# Patient Record
Sex: Female | Born: 1990 | Race: Black or African American | Hispanic: No | Marital: Single | Smoking: Never smoker
Health system: Southern US, Community
[De-identification: ages and names within clinical notes are randomized; demographics above are authoritative.]

## PROBLEM LIST (undated history)

## (undated) DIAGNOSIS — O009 Unspecified ectopic pregnancy without intrauterine pregnancy: Secondary | ICD-10-CM

---

## 2014-08-12 ENCOUNTER — Other Ambulatory Visit (HOSPITAL_BASED_OUTPATIENT_CLINIC_OR_DEPARTMENT_OTHER): Payer: Self-pay | Admitting: Emergency Medicine

## 2014-08-12 ENCOUNTER — Emergency Department (HOSPITAL_BASED_OUTPATIENT_CLINIC_OR_DEPARTMENT_OTHER)
Admission: EM | Admit: 2014-08-12 | Discharge: 2014-08-12 | Disposition: A | Discharge status: Home / Self Care | Payer: Self-pay | Attending: Emergency Medicine | Admitting: Emergency Medicine

## 2014-08-12 ENCOUNTER — Encounter (HOSPITAL_BASED_OUTPATIENT_CLINIC_OR_DEPARTMENT_OTHER): Payer: Self-pay | Admitting: Emergency Medicine

## 2014-08-12 ENCOUNTER — Ambulatory Visit (HOSPITAL_BASED_OUTPATIENT_CLINIC_OR_DEPARTMENT_OTHER)
Admit: 2014-08-12 | Discharge: 2014-08-12 | Disposition: A | Discharge status: Home / Self Care | Payer: Self-pay | Source: Ambulatory Visit | Attending: Emergency Medicine | Admitting: Emergency Medicine

## 2014-08-12 ENCOUNTER — Ambulatory Visit (HOSPITAL_BASED_OUTPATIENT_CLINIC_OR_DEPARTMENT_OTHER): Admit: 2014-08-12 | Payer: Self-pay

## 2014-08-12 DIAGNOSIS — R1031 Right lower quadrant pain: Secondary | ICD-10-CM | POA: Insufficient documentation

## 2014-08-12 DIAGNOSIS — Z3202 Encounter for pregnancy test, result negative: Secondary | ICD-10-CM | POA: Insufficient documentation

## 2014-08-12 DIAGNOSIS — Z8742 Personal history of other diseases of the female genital tract: Secondary | ICD-10-CM | POA: Insufficient documentation

## 2014-08-12 DIAGNOSIS — R9389 Abnormal findings on diagnostic imaging of other specified body structures: Secondary | ICD-10-CM | POA: Insufficient documentation

## 2014-08-12 DIAGNOSIS — R102 Pelvic and perineal pain: Secondary | ICD-10-CM

## 2014-08-12 DIAGNOSIS — Z79899 Other long term (current) drug therapy: Secondary | ICD-10-CM | POA: Insufficient documentation

## 2014-08-12 DIAGNOSIS — R109 Unspecified abdominal pain: Secondary | ICD-10-CM

## 2014-08-12 HISTORY — DX: Unspecified ectopic pregnancy without intrauterine pregnancy: O00.90

## 2014-08-12 LAB — PREGNANCY, URINE: PREG TEST UR: NEGATIVE

## 2014-08-12 LAB — URINALYSIS, ROUTINE W REFLEX MICROSCOPIC
Bilirubin Urine: NEGATIVE
Glucose, UA: NEGATIVE mg/dL
Hgb urine dipstick: NEGATIVE
Ketones, ur: NEGATIVE mg/dL
Leukocytes, UA: NEGATIVE
NITRITE: NEGATIVE
Protein, ur: NEGATIVE mg/dL
Specific Gravity, Urine: 1.005 (ref 1.005–1.030)
UROBILINOGEN UA: 0.2 mg/dL (ref 0.0–1.0)
pH: 6.5 (ref 5.0–8.0)

## 2014-08-12 MED ORDER — TRAMADOL HCL 50 MG PO TABS: 50.0000 mg | ORAL_TABLET | Freq: Four times a day (QID) | ORAL | Status: DC | PRN

## 2014-08-12 NOTE — ED Provider Notes (Signed)
CSN: [id number]     Arrival date & time 08/12/14  0005 History   First MD Initiated Contact with Patient 08/12/14 0045     Chief Complaint  Patient presents with  . Abdominal Pain     (Consider location/radiation/quality/duration/timing/severity/associated sxs/prior Treatment) HPI Comments: Patient is a 23 year old female with no significant past medical history. She presents with complaints of pain in her lower abdomen that has been coming and going for the past month. It is getting worse. There are no fevers and chills. She denies any urinary complaints. She states last time she felt like this she was pregnant and had a cyst on her ovary.  Patient is a 23 y.o. female presenting with abdominal pain. The history is provided by the patient.  Abdominal Pain Pain location:  RLQ Pain quality: cramping   Pain severity:  Moderate Onset quality:  Gradual Duration:  30 days Timing:  Intermittent Progression:  Worsening Chronicity:  Recurrent Relieved by:  Nothing Worsened by:  Movement and palpation Ineffective treatments:  None tried   Past Medical History  Diagnosis Date  . Ectopic pregnancy    History reviewed. No pertinent past surgical history. History reviewed. No pertinent family history. History  Substance Use Topics  . Smoking status: Never Smoker   . Smokeless tobacco: Not on file  . Alcohol Use: No   OB History   Grav Para Term Preterm Abortions TAB SAB Ect Mult Living                 Review of Systems  Gastrointestinal: Positive for abdominal pain.  All other systems reviewed and are negative.     Allergies  Review of patient's allergies indicates no known allergies.  Home Medications   Prior to Admission medications   Medication Sig Start Date End Date Taking? Authorizing Provider  valACYclovir (VALTREX) 500 MG tablet Take 500 mg by mouth 2 (two) times daily.   Yes Historical Provider, MD   BP 133/80  Pulse 82  Temp(Src) 97.9 F (36.6 C) (Oral)   Resp 18  Ht 5\' 10"  (1.778 m)  Wt 125 lb (56.7 kg)  BMI 17.94 kg/m2  SpO2 100%  LMP 07/31/2014 Physical Exam  Nursing note and vitals reviewed. Constitutional: She is oriented to person, place, and time. She appears well-developed and well-nourished. No distress.  HENT:  Head: Normocephalic and atraumatic.  Neck: Normal range of motion. Neck supple.  Cardiovascular: Normal rate and regular rhythm.  Exam reveals no gallop and no friction rub.   No murmur heard. Pulmonary/Chest: Effort normal and breath sounds normal. No respiratory distress. She has no wheezes.  Abdominal: Soft. Bowel sounds are normal. She exhibits no distension. There is tenderness. There is no rebound and no guarding.  There is mild tenderness to palpation in the right lower quadrant and suprapubic region. There is no rebound and no guarding.  Musculoskeletal: Normal range of motion.  Neurological: She is alert and oriented to person, place, and time.  Skin: Skin is warm and dry. She is not diaphoretic.    ED Course  Procedures (including critical care time) Labs Review Labs Reviewed  URINALYSIS, ROUTINE W REFLEX MICROSCOPIC  PREGNANCY, URINE    Imaging Review No results found.   EKG Interpretation None      MDM   Final diagnoses:  None    Urinalysis shows nothing to suggest infection or renal calculus. Pregnancy test is negative. He states his pain is similar to her prior discomfort she had an ovarian cyst  I suspect that is what is going on again. As there is no ultrasound tech on duty this evening, I will bring her back tomorrow for an ultrasound to rule out this possibility. She is not having any bleeding or discharge and at her request we'll forego the pelvic examination at this point. I will prescribe tramadol she can take as needed for pain.    Geoffery Lyons, MD 08/12/14 [id number]

## 2014-08-12 NOTE — ED Provider Notes (Signed)
Patient returned for [place].  Presented last night with right lower quadrant pain. Ultrasound results are below. Discuss with patient right ovarian cyst which is likely the cause of her pain. Patient reports improvement of pain. Patient also has a regular endometrial stripe for which she needs followup. She will followup with her OB/GYN. Patient was given imaging report for followup purposes.  Results for orders placed during the hospital encounter of 08/12/14  URINALYSIS, ROUTINE W REFLEX MICROSCOPIC      Result Value Ref Range   Color, Urine YELLOW  YELLOW   APPearance CLEAR  CLEAR   Specific Gravity, Urine 1.005  1.005 - 1.030   pH 6.5  5.0 - 8.0   Glucose, UA NEGATIVE  NEGATIVE mg/dL   Hgb urine dipstick NEGATIVE  NEGATIVE   Bilirubin Urine NEGATIVE  NEGATIVE   Ketones, ur NEGATIVE  NEGATIVE mg/dL   Protein, ur NEGATIVE  NEGATIVE mg/dL   Urobilinogen, UA 0.2  0.0 - 1.0 mg/dL   Nitrite NEGATIVE  NEGATIVE   Leukocytes, UA NEGATIVE  NEGATIVE  PREGNANCY, URINE      Result Value Ref Range   Preg Test, Ur NEGATIVE  NEGATIVE   US Transvaginal Non-ob  08/12/2014   CLINICAL DATA:  RIGHT lower quadrant pain for 1 month, negative pregnancy test, history of ovarian cysts, past history ectopic pregnancy  EXAM: TRANSABDOMINAL AND TRANSVAGINAL ULTRASOUND OF PELVIS  TECHNIQUE: Both transabdominal and transvaginal ultrasound examinations of the pelvis were performed. Transabdominal technique was performed for global imaging of the pelvis including uterus, ovaries, adnexal regions, and pelvic cul-de-sac. It was necessary to proceed with endovaginal exam following the transabdominal exam to visualize the endometrium.  COMPARISON:  09/21/2011 early OB ultrasound  FINDINGS: Uterus  Measurements: 8.9 x 5.3 x 5.9 cm. Heterogeneous myometrial echogenicity. No focal mass lesion.  Endometrium  Thickness: 15 mm thick. Abnormal appearance, heterogeneous with areas of hypo echogenicity which may represent complex fluid  and/or blood.  Right ovary  Measurements: 5.5 x 2.1 x 3.3 cm. Small collapsed cyst 2.2 x 1.5 x 1.8 cm. No additional mass. Blood flow present within RIGHT ovary on color Doppler imaging.  Left ovary  Measurements: 3.8 x 2.3 x 2.2 cm. Normal morphology without mass. Blood flow present within LEFT ovary on color Doppler imaging.  Other findings  Small to moderate amount of simple appearing free pelvic fluid.  IMPRESSION: Collapsed RIGHT ovarian cyst with small to moderate amount of free pelvic fluid.  Abnormal appearance of the endometrial complex, 15 mm thick but heterogeneous in echogenicity with areas of hypo echogenicity question fluid versus blood.  A focal endometrial lesion is questioned.  Consider sonohysterogram for further evaluation, prior to hysteroscopy or endometrial biopsy.   Electronically Signed   By: Ulyses Southward M.D.   On: 08/12/2014 13:32   US Pelvis Complete  08/12/2014   CLINICAL DATA:  RIGHT lower quadrant pain for 1 month, negative pregnancy test, history of ovarian cysts, past history ectopic pregnancy  EXAM: TRANSABDOMINAL AND TRANSVAGINAL ULTRASOUND OF PELVIS  TECHNIQUE: Both transabdominal and transvaginal ultrasound examinations of the pelvis were performed. Transabdominal technique was performed for global imaging of the pelvis including uterus, ovaries, adnexal regions, and pelvic cul-de-sac. It was necessary to proceed with endovaginal exam following the transabdominal exam to visualize the endometrium.  COMPARISON:  09/21/2011 early OB ultrasound  FINDINGS: Uterus  Measurements: 8.9 x 5.3 x 5.9 cm. Heterogeneous myometrial echogenicity. No focal mass lesion.  Endometrium  Thickness: 15 mm thick. Abnormal appearance, heterogeneous with  areas of hypo echogenicity which may represent complex fluid and/or blood.  Right ovary  Measurements: 5.5 x 2.1 x 3.3 cm. Small collapsed cyst 2.2 x 1.5 x 1.8 cm. No additional mass. Blood flow present within RIGHT ovary on color Doppler imaging.  Left  ovary  Measurements: 3.8 x 2.3 x 2.2 cm. Normal morphology without mass. Blood flow present within LEFT ovary on color Doppler imaging.  Other findings  Small to moderate amount of simple appearing free pelvic fluid.  IMPRESSION: Collapsed RIGHT ovarian cyst with small to moderate amount of free pelvic fluid.  Abnormal appearance of the endometrial complex, 15 mm thick but heterogeneous in echogenicity with areas of hypo echogenicity question fluid versus blood.  A focal endometrial lesion is questioned.  Consider sonohysterogram for further evaluation, prior to hysteroscopy or endometrial biopsy.   Electronically Signed   By: Ulyses SouthwardMark  Boles M.D.   On: 08/12/2014 13:32      Shon Batonourtney F Jomes Giraldo, MD 08/12/14 1344

## 2014-08-12 NOTE — ED Notes (Signed)
Pt reports lower abdominal pain for past month that has gotten progressively worse, now c/o difficulty urinating

## 2014-08-12 NOTE — Discharge Instructions (Signed)
Tramadol as needed for pain.  Return tomorrow for an ultrasound at a given time to rule out the possibility of an ovarian cyst.   Abdominal Pain, Women Abdominal (stomach, pelvic, or belly) pain can be caused by many things. It is important to tell your doctor:  The location of the pain.  Does it come and go or is it present all the time?  Are there things that start the pain (eating certain foods, exercise)?  Are there other symptoms associated with the pain (fever, nausea, vomiting, diarrhea)? All of this is helpful to know when trying to find the cause of the pain. CAUSES   Stomach: virus or bacteria infection, or ulcer.  Intestine: appendicitis (inflamed appendix), regional ileitis (Crohn's disease), ulcerative colitis (inflamed colon), irritable bowel syndrome, diverticulitis (inflamed diverticulum of the colon), or cancer of the stomach or intestine.  Gallbladder disease or stones in the gallbladder.  Kidney disease, kidney stones, or infection.  Pancreas infection or cancer.  Fibromyalgia (pain disorder).  Diseases of the female organs:  Uterus: fibroid (non-cancerous) tumors or infection.  Fallopian tubes: infection or tubal pregnancy.  Ovary: cysts or tumors.  Pelvic adhesions (scar tissue).  Endometriosis (uterus lining tissue growing in the pelvis and on the pelvic organs).  Pelvic congestion syndrome (female organs filling up with blood just before the menstrual period).  Pain with the menstrual period.  Pain with ovulation (producing an egg).  Pain with an IUD (intrauterine device, birth control) in the uterus.  Cancer of the female organs.  Functional pain (pain not caused by a disease, may improve without treatment).  Psychological pain.  Depression. DIAGNOSIS  Your doctor will decide the seriousness of your pain by doing an examination.  Blood tests.  X-rays.  Ultrasound.  CT scan (computed tomography, special type of X-ray).  MRI  (magnetic resonance imaging).  Cultures, for infection.  Barium enema (dye inserted in the large intestine, to better view it with X-rays).  Colonoscopy (looking in intestine with a lighted tube).  Laparoscopy (minor surgery, looking in abdomen with a lighted tube).  Major abdominal exploratory surgery (looking in abdomen with a large incision). TREATMENT  The treatment will depend on the cause of the pain.   Many cases can be observed and treated at home.  Over-the-counter medicines recommended by your caregiver.  Prescription medicine.  Antibiotics, for infection.  Birth control pills, for painful periods or for ovulation pain.  Hormone treatment, for endometriosis.  Nerve blocking injections.  Physical therapy.  Antidepressants.  Counseling with a psychologist or psychiatrist.  Minor or major surgery. HOME CARE INSTRUCTIONS   Do not take laxatives, unless directed by your caregiver.  Take over-the-counter pain medicine only if ordered by your caregiver. Do not take aspirin because it can cause an upset stomach or bleeding.  Try a clear liquid diet (broth or water) as ordered by your caregiver. Slowly move to a bland diet, as tolerated, if the pain is related to the stomach or intestine.  Have a thermometer and take your temperature several times a day, and record it.  Bed rest and sleep, if it helps the pain.  Avoid sexual intercourse, if it causes pain.  Avoid stressful situations.  Keep your follow-up appointments and tests, as your caregiver orders.  If the pain does not go away with medicine or surgery, you may try:  Acupuncture.  Relaxation exercises (yoga, meditation).  Group therapy.  Counseling. SEEK MEDICAL CARE IF:   You notice certain foods cause stomach pain.  Your home care treatment is not helping your pain.  You need stronger pain medicine.  You want your IUD removed.  You feel faint or lightheaded.  You develop nausea and  vomiting.  You develop a rash.  You are having side effects or an allergy to your medicine. SEEK IMMEDIATE MEDICAL CARE IF:   Your pain does not go away or gets worse.  You have a fever.  Your pain is felt only in portions of the abdomen. The right side could possibly be appendicitis. The left lower portion of the abdomen could be colitis or diverticulitis.  You are passing blood in your stools (bright red or black tarry stools, with or without vomiting).  You have blood in your urine.  You develop chills, with or without a fever.  You pass out. MAKE SURE YOU:   Understand these instructions.  Will watch your condition.  Will get help right away if you are not doing well or get worse. Document Released: 10/13/2007 Document Revised: 05/02/2014 Document Reviewed: 11/02/2009 [facility] Patient Information 2015 [place], [place]. This information is not intended to replace advice given to you by your health care provider. Make sure you discuss any questions you have with your health care provider.

## 2015-05-16 IMAGING — US US TRANSVAGINAL NON-OB
1 series · 13 of 25 positions shown · non-contrast
Comparison: 09/21/2011 early OB ultrasound

CLINICAL DATA: RIGHT lower quadrant pain for 1 month, negative
pregnancy test, history of ovarian cysts, past history ectopic
pregnancy



[Series 1: us transvaginal non-ob · 0.26mm/px · 13 of 107 slices shown]
[im 1/107]
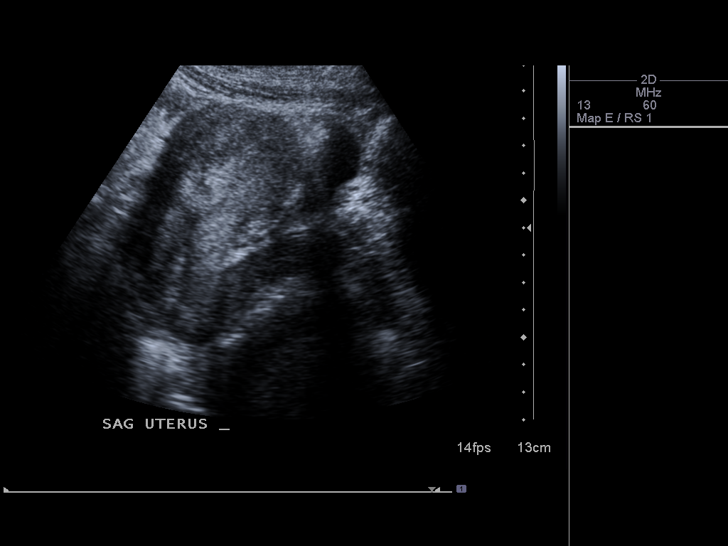
[im 9/107]
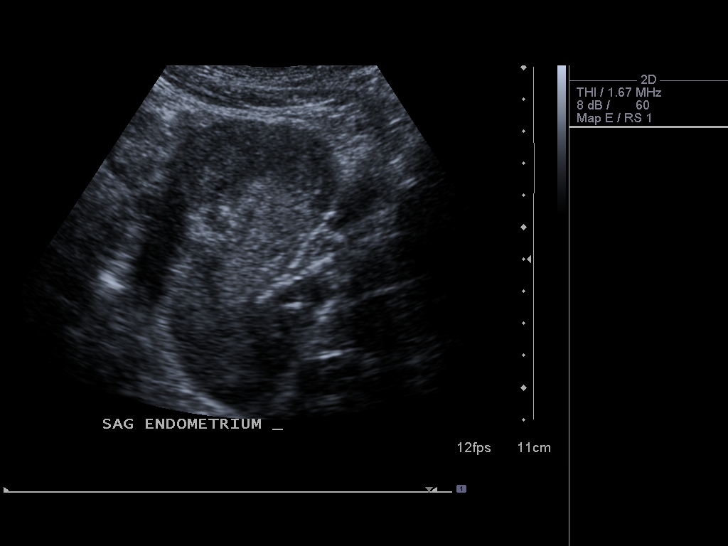
[im 18/107]
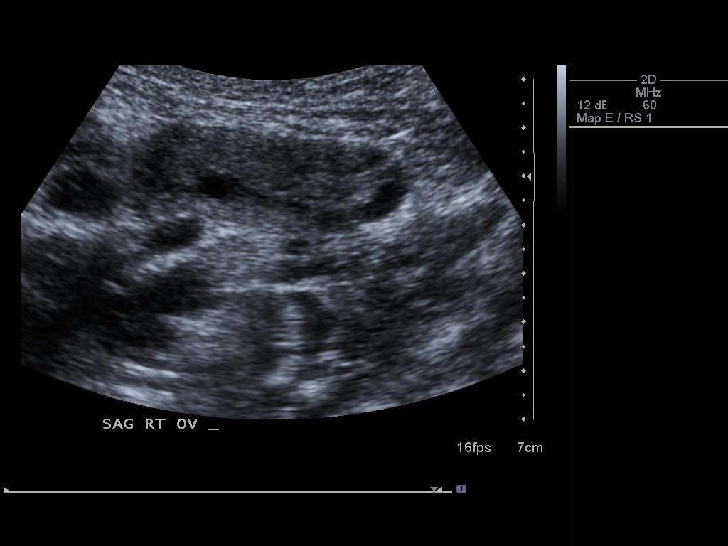
[im 27/107]
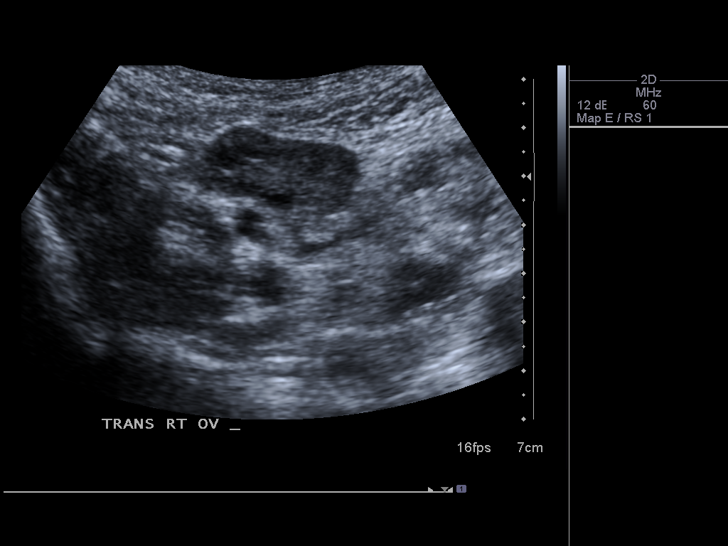
[im 36/107]
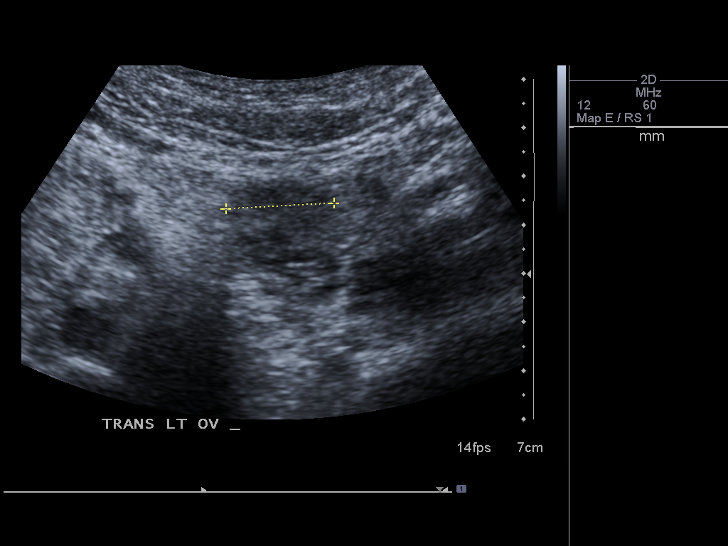
[im 45/107]
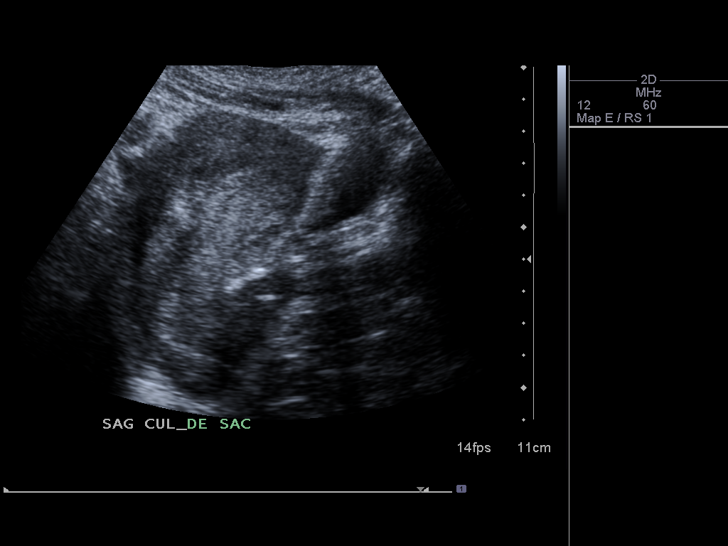
[im 54/107]
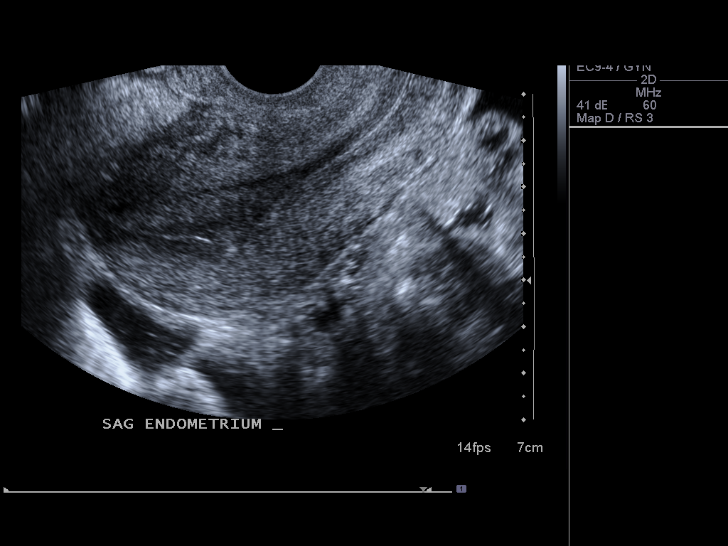
[im 62/107]
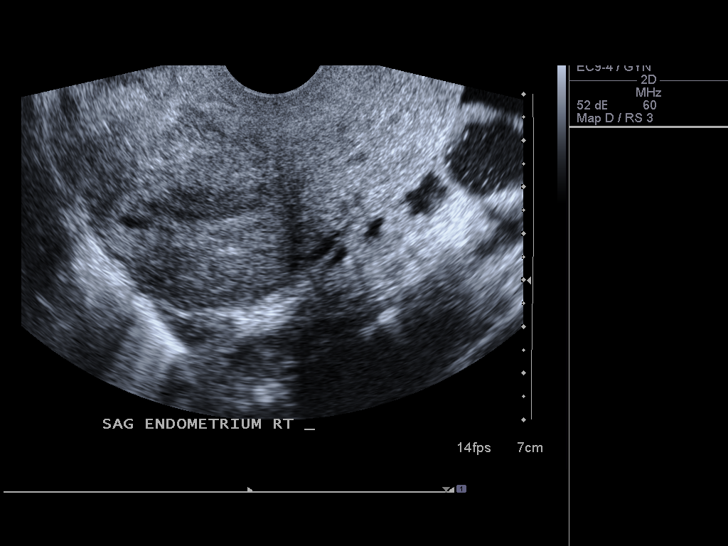
[im 71/107]
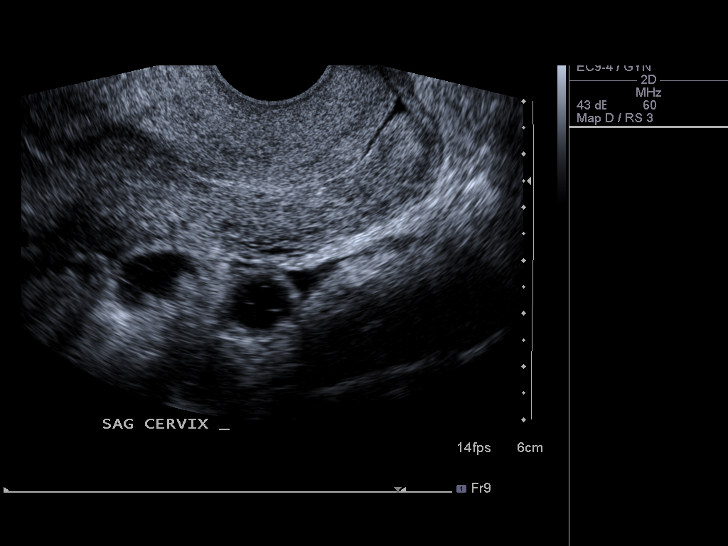
[im 80/107]
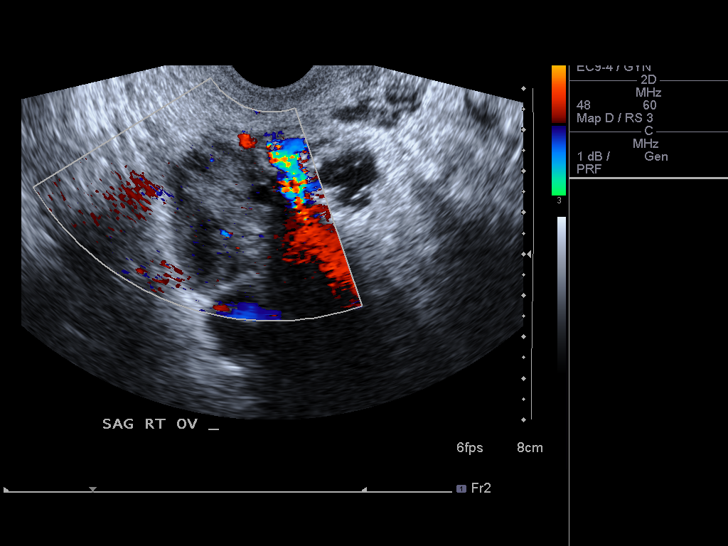
[im 89/107]
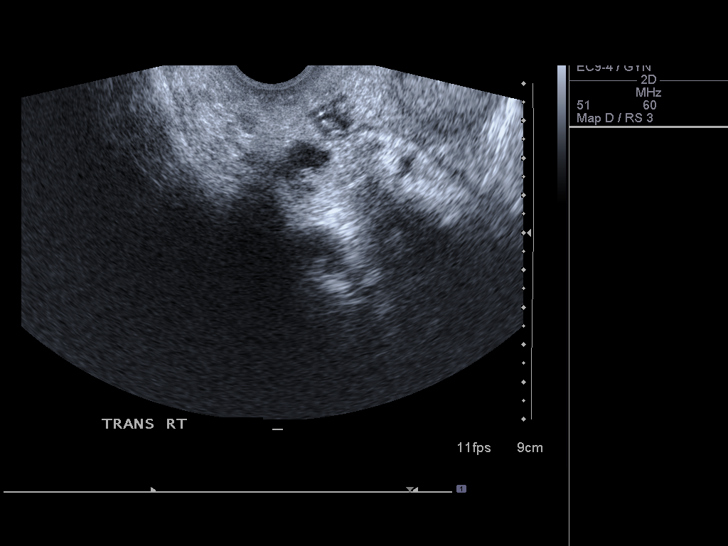
[im 98/107]
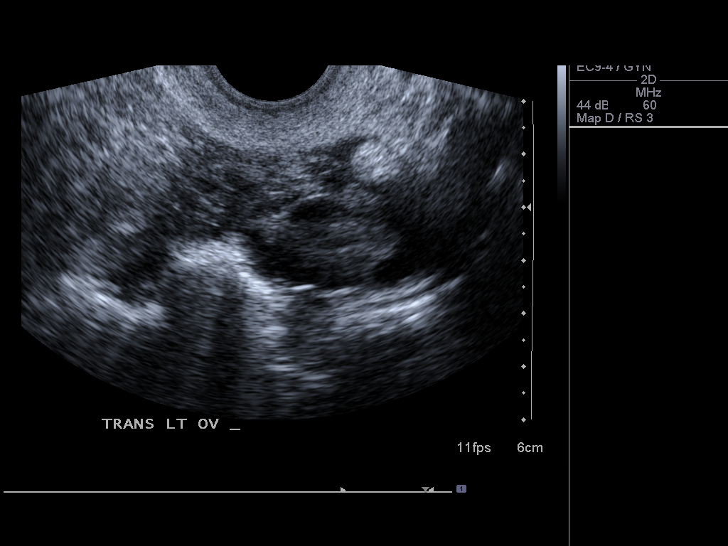
[im 107/107]
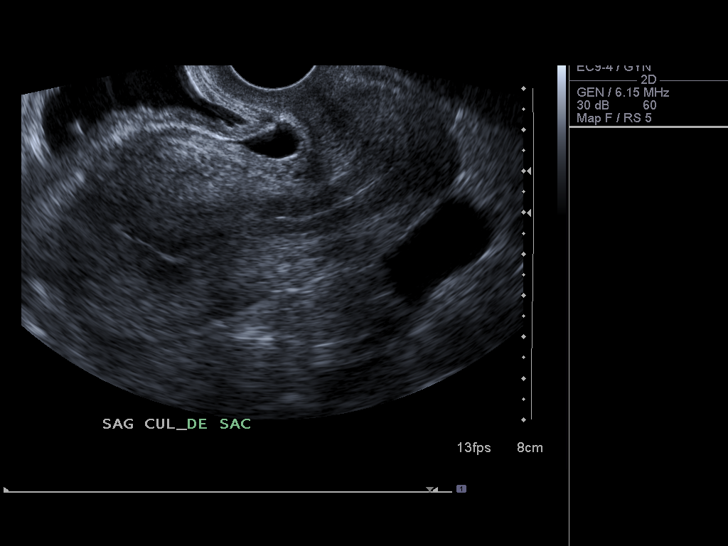

[13 of 25 positions shown; findings below may reference images not displayed]

FINDINGS: Uterus

Measurements: 8.9 x 5.3 x 5.9 cm. Heterogeneous myometrial
echogenicity. No focal mass lesion.

Endometrium

Thickness: 15 mm thick. Abnormal appearance, heterogeneous with
areas of hypo echogenicity which may represent complex fluid and/or
blood.

Right ovary

Measurements: 5.5 x 2.1 x 3.3 cm. Small collapsed cyst 2.2 x 1.5 x
1.8 cm. No additional mass. Blood flow present within RIGHT ovary on
color Doppler imaging.

Left ovary

Measurements: 3.8 x 2.3 x 2.2 cm. Normal morphology without mass.
Blood flow present within LEFT ovary on color Doppler imaging.

Other findings

Small to moderate amount of simple appearing free pelvic fluid.
IMPRESSION: Collapsed RIGHT ovarian cyst with small to moderate amount of free
pelvic fluid.

Abnormal appearance of the endometrial complex, 15 mm thick but
heterogeneous in echogenicity with areas of hypo echogenicity
question fluid versus blood.

A focal endometrial lesion is questioned.

Consider sonohysterogram for further evaluation, prior to
hysteroscopy or endometrial biopsy.

## 2018-05-02 ENCOUNTER — Emergency Department (HOSPITAL_BASED_OUTPATIENT_CLINIC_OR_DEPARTMENT_OTHER)
Admission: EM | Admit: 2018-05-02 | Discharge: 2018-05-02 | Disposition: A | Discharge status: Home / Self Care | Payer: 59 | Attending: Emergency Medicine | Admitting: Emergency Medicine

## 2018-05-02 ENCOUNTER — Encounter (HOSPITAL_BASED_OUTPATIENT_CLINIC_OR_DEPARTMENT_OTHER): Payer: Self-pay | Admitting: Emergency Medicine

## 2018-05-02 ENCOUNTER — Emergency Department (HOSPITAL_BASED_OUTPATIENT_CLINIC_OR_DEPARTMENT_OTHER): Payer: 59

## 2018-05-02 ENCOUNTER — Other Ambulatory Visit: Payer: Self-pay

## 2018-05-02 DIAGNOSIS — Z79899 Other long term (current) drug therapy: Secondary | ICD-10-CM | POA: Insufficient documentation

## 2018-05-02 LAB — CBC WITH DIFFERENTIAL/PLATELET
BASOS ABS: 0 10*3/uL (ref 0.0–0.1)
Basophils Relative: 0 %
Eosinophils Absolute: 0.1 10*3/uL (ref 0.0–0.7)
Eosinophils Relative: 2 %
HEMATOCRIT: 32.4 % — AB (ref 36.0–46.0)
Hemoglobin: 11.1 g/dL — ABNORMAL LOW (ref 12.0–15.0)
LYMPHS ABS: 2 10*3/uL (ref 0.7–4.0)
LYMPHS PCT: 41 %
MCH: 28.3 pg (ref 26.0–34.0)
MCHC: 34.3 g/dL (ref 30.0–36.0)
MCV: 82.7 fL (ref 78.0–100.0)
MONOS PCT: 8 %
Monocytes Absolute: 0.4 10*3/uL (ref 0.1–1.0)
Neutro Abs: 2.4 10*3/uL (ref 1.7–7.7)
Neutrophils Relative %: 49 %
Platelets: 232 10*3/uL (ref 150–400)
RBC: 3.92 MIL/uL (ref 3.87–5.11)
RDW: 14.7 % (ref 11.5–15.5)
WBC: 4.9 10*3/uL (ref 4.0–10.5)

## 2018-05-02 LAB — URINALYSIS, ROUTINE W REFLEX MICROSCOPIC

## 2018-05-02 LAB — URINALYSIS, MICROSCOPIC (REFLEX): RBC / HPF: >50 RBC/hpf (ref 0–5)

## 2018-05-02 LAB — PREGNANCY, URINE: Preg Test, Ur: NEGATIVE

## 2018-05-02 MED ORDER — NORGESTIMATE-ETH ESTRADIOL 0.25-35 MG-MCG PO TABS: 1.0000 | ORAL_TABLET | Freq: Every day | ORAL | 11 refills | Status: DC

## 2018-05-02 NOTE — ED Triage Notes (Signed)
Patient states that she feels like she may be having post partum hemoraging. The patient reports that she delivers 5 weeks ago  -  Patient states that she is saturating 1 pad and half every day  Since she gave birth. It has been the worst over the last 3 -4 days.

## 2018-05-02 NOTE — ED Notes (Signed)
Rx x 1 given at d/c

## 2018-05-02 NOTE — ED Provider Notes (Addendum)
[facility] EMERGENCY DEPARTMENT Provider Note   CSN: [id number] Arrival date & time: 05/02/18  1443     History   Chief Complaint Chief Complaint  Patient presents with  . Vaginal Bleeding    HPI Jean Saunders is a 27 y.o. female.  Patient is a 27 year old female who had a vaginal delivery, full-term on March 27.  She had no complicating factors with the delivery.  She states that she had bleeding for about 2 weeks.  She states the bleeding stopped for about a week and a half and then started back again.  She has had vaginal bleeding for about the last 2 weeks.  She states it was normal light bleeding at that point.  Over the last 2 to 3 days, it has been much heavier and she is passing clots.  She states that she is soaking through a pad about every 45 minutes to an hour.  She has some mild dizziness but no shortness of breath or excessive fatigue.  She is not breast-feeding.  She has no history of similar problems in the past.  No associated abdominal pain.  No nausea or vomiting.  No known fevers.  She contacted the nurse line with her OB/GYN who advised her to be seen in the emergency room.     Past Medical History:  Diagnosis Date  . Ectopic pregnancy     There are no active problems to display for this patient.   History reviewed. No pertinent surgical history.   OB History   None      Home Medications    Prior to Admission medications   Medication Sig Start Date End Date Taking? Authorizing Provider  norgestimate-ethinyl estradiol (ORTHO-CYCLEN, 28,) 0.25-35 MG-MCG tablet Take 1 tablet by mouth daily. 05/02/18   Rolan Bucco, MD  traMADol (ULTRAM) 50 MG tablet Take 1 tablet (50 mg total) by mouth every 6 (six) hours as needed. 08/12/14   Geoffery Lyons, MD  valACYclovir (VALTREX) 500 MG tablet Take 500 mg by mouth 2 (two) times daily.    [provider]    Family History History reviewed. No pertinent family history.  Social  History Social History   Tobacco Use  . Smoking status: Never Smoker  . Smokeless tobacco: Never Used  Substance Use Topics  . Alcohol use: No  . Drug use: Not on file     Allergies   Patient has no known allergies.   Review of Systems Review of Systems  Constitutional: Negative for chills, diaphoresis, fatigue and fever.  HENT: Negative for congestion, rhinorrhea and sneezing.   Eyes: Negative.   Respiratory: Negative for cough, chest tightness and shortness of breath.   Cardiovascular: Negative for chest pain and leg swelling.  Gastrointestinal: Negative for abdominal pain, blood in stool, diarrhea, nausea and vomiting.  Genitourinary: Positive for vaginal bleeding. Negative for difficulty urinating, flank pain, frequency, hematuria and vaginal discharge.  Musculoskeletal: Negative for arthralgias and back pain.  Skin: Negative for rash.  Neurological: Positive for light-headedness. Negative for dizziness, speech difficulty, weakness, numbness and headaches.     Physical Exam Updated Vital Signs BP 129/76 (BP Location: Right Arm)   Pulse 78   Temp 98.2 F (36.8 C) (Oral)   Resp 18   Ht  (1.6 m)   Wt 81.6 kg (180 lb)   SpO2 100%   BMI 31.89 kg/m   Physical Exam  Constitutional: She is oriented to person, place, and time. She appears well-developed and well-nourished.  HENT:  Head: Normocephalic and atraumatic.  Eyes: Pupils are equal, round, and reactive to light.  Neck: Normal range of motion. Neck supple.  Cardiovascular: Normal rate, regular rhythm and normal heart sounds.  Pulmonary/Chest: Effort normal and breath sounds normal. No respiratory distress. She has no wheezes. She has no rales. She exhibits no tenderness.  Abdominal: Soft. Bowel sounds are normal. There is no tenderness. There is no rebound and no guarding.  Genitourinary:  Genitourinary Comments: Patient has some steady trickle of blood through the vaginal eyes but no heavy bleeding.   There is some dark blood pooling in the vault.  No clots are visualized.  No significant tenderness on exam.  No significant lacerations are noted.  Musculoskeletal: Normal range of motion. She exhibits no edema.  Lymphadenopathy:    She has no cervical adenopathy.  Neurological: She is alert and oriented to person, place, and time.  Skin: Skin is warm and dry. No rash noted.  Psychiatric: She has a normal mood and affect.     ED Treatments / Results  Labs (all labs ordered are listed, but only abnormal results are displayed) Labs Reviewed  CBC WITH DIFFERENTIAL/PLATELET - Abnormal; Notable for the following components:      Result Value   Hemoglobin 11.1 (*)    HCT 32.4 (*)    All other components within normal limits  URINALYSIS, ROUTINE W REFLEX MICROSCOPIC - Abnormal; Notable for the following components:   Color, Urine RED (*)    APPearance TURBID (*)    Glucose, UA   (*)    Value: TEST NOT REPORTED DUE TO COLOR INTERFERENCE OF URINE PIGMENT   Hgb urine dipstick   (*)    Value: TEST NOT REPORTED DUE TO COLOR INTERFERENCE OF URINE PIGMENT   Bilirubin Urine   (*)    Value: TEST NOT REPORTED DUE TO COLOR INTERFERENCE OF URINE PIGMENT   Ketones, ur   (*)    Value: TEST NOT REPORTED DUE TO COLOR INTERFERENCE OF URINE PIGMENT   Protein, ur   (*)    Value: TEST NOT REPORTED DUE TO COLOR INTERFERENCE OF URINE PIGMENT   Nitrite   (*)    Value: TEST NOT REPORTED DUE TO COLOR INTERFERENCE OF URINE PIGMENT   Leukocytes, UA   (*)    Value: TEST NOT REPORTED DUE TO COLOR INTERFERENCE OF URINE PIGMENT   All other components within normal limits  URINALYSIS, MICROSCOPIC (REFLEX) - Abnormal; Notable for the following components:   Bacteria, UA MANY (*)    All other components within normal limits  PREGNANCY, URINE    EKG None  Radiology US Transvaginal Non-ob  Result Date: 05/02/2018 CLINICAL DATA:  Postpartum bleeding. EXAM: TRANSABDOMINAL AND TRANSVAGINAL ULTRASOUND OF PELVIS  TECHNIQUE: Both transabdominal and transvaginal ultrasound examinations of the pelvis were performed. Transabdominal technique was performed for global imaging of the pelvis including uterus, ovaries, adnexal regions, and pelvic cul-de-sac. It was necessary to proceed with endovaginal exam following the transabdominal exam to visualize the endometrium and ovaries. COMPARISON:  None FINDINGS: Uterus Measurements: 10.4 x 6.0 x 5.8 cm. No fibroids or other mass visualized. Endometrium Thickness: 5 mm which is within normal limits. No focal abnormality visualized. Right ovary Not visualized. Left ovary Measurements: 4.0 x 3.5 x 2.7 cm. Normal appearance/no adnexal mass. Other findings Trace free fluid is noted which most likely is physiologic. IMPRESSION: Right ovary is not visualized. No definite abnormality seen in the pelvis. Electronically Signed   By: Fayrene Fearing  Christen Butter, M.D.   On: 05/02/2018 17:29   US Pelvis Complete  Result Date: 05/02/2018 CLINICAL DATA:  Postpartum bleeding. EXAM: TRANSABDOMINAL AND TRANSVAGINAL ULTRASOUND OF PELVIS TECHNIQUE: Both transabdominal and transvaginal ultrasound examinations of the pelvis were performed. Transabdominal technique was performed for global imaging of the pelvis including uterus, ovaries, adnexal regions, and pelvic cul-de-sac. It was necessary to proceed with endovaginal exam following the transabdominal exam to visualize the endometrium and ovaries. COMPARISON:  None FINDINGS: Uterus Measurements: 10.4 x 6.0 x 5.8 cm. No fibroids or other mass visualized. Endometrium Thickness: 5 mm which is within normal limits. No focal abnormality visualized. Right ovary Not visualized. Left ovary Measurements: 4.0 x 3.5 x 2.7 cm. Normal appearance/no adnexal mass. Other findings Trace free fluid is noted which most likely is physiologic. IMPRESSION: Right ovary is not visualized. No definite abnormality seen in the pelvis. Electronically Signed   By: Lupita Raider, M.D.   On:  05/02/2018 17:29    Procedures Procedures (including critical care time)  Medications Ordered in ED Medications - No data to display   Initial Impression / Assessment and Plan / ED Course  I have reviewed the triage vital signs and the nursing notes.  Pertinent labs & imaging results that were available during my care of the patient were reviewed by me and considered in my medical decision making (see chart for details).     Patient is a 27 year old female who presents with postpartum bleeding.  Her pregnancy test is negative.  Her hemoglobin is non-concerning.  Her vital signs are stable.  She has no tachycardia or hypotension.  She had a pelvic ultrasound performed which shows no evidence of thickened endometrium or retained products of conception.  I spoke with the OB/GYN who is on-call for her practice at [facility], Dr. Loreta Ave.  She recommended starting the patient on oral contraceptives and having the patient take 2 of them today and then go back to taking 1 a day after that.  She has sent a message to the office to arrange an appointment for the patient this week for recheck.  I advised patient to call the office on Monday if her bleeding has not improved.  I advised her to return to the emergency room if she has heavier bleeding, worsening dizziness, shortness of breath increased abdominal pain or other worsening symptoms.  Final Clinical Impressions(s) / ED Diagnoses   Final diagnoses:  Postpartum hemorrhage, unspecified type    ED Discharge Orders        Ordered    norgestimate-ethinyl estradiol (ORTHO-CYCLEN, 28,) 0.25-35 MG-MCG tablet  Daily     05/02/18 1801       Rolan Bucco, MD 05/02/18 5366    Rolan Bucco, MD 05/02/18 1806

## 2018-05-02 NOTE — ED Notes (Signed)
Patient transported to Ultrasound 

## 2018-05-02 NOTE — ED Notes (Signed)
ED Provider at bedside. 

## 2019-09-26 ENCOUNTER — Emergency Department (HOSPITAL_BASED_OUTPATIENT_CLINIC_OR_DEPARTMENT_OTHER)
Admission: EM | Admit: 2019-09-26 | Discharge: 2019-09-26 | Disposition: A | Discharge status: Home / Self Care | Payer: 59 | Attending: Emergency Medicine | Admitting: Emergency Medicine

## 2019-09-26 ENCOUNTER — Other Ambulatory Visit (HOSPITAL_BASED_OUTPATIENT_CLINIC_OR_DEPARTMENT_OTHER): Payer: Self-pay | Admitting: Emergency Medicine

## 2019-09-26 ENCOUNTER — Other Ambulatory Visit: Payer: Self-pay

## 2019-09-26 ENCOUNTER — Ambulatory Visit (HOSPITAL_BASED_OUTPATIENT_CLINIC_OR_DEPARTMENT_OTHER)
Admission: RE | Admit: 2019-09-26 | Discharge: 2019-09-26 | Disposition: A | Discharge status: Home / Self Care | Payer: 59 | Source: Ambulatory Visit | Attending: Emergency Medicine | Admitting: Emergency Medicine

## 2019-09-26 ENCOUNTER — Encounter (HOSPITAL_BASED_OUTPATIENT_CLINIC_OR_DEPARTMENT_OTHER): Payer: Self-pay | Admitting: Adult Health

## 2019-09-26 DIAGNOSIS — N939 Abnormal uterine and vaginal bleeding, unspecified: Secondary | ICD-10-CM | POA: Diagnosis not present

## 2019-09-26 DIAGNOSIS — Z3201 Encounter for pregnancy test, result positive: Secondary | ICD-10-CM | POA: Diagnosis not present

## 2019-09-26 DIAGNOSIS — Z3A01 Less than 8 weeks gestation of pregnancy: Secondary | ICD-10-CM | POA: Diagnosis not present

## 2019-09-26 DIAGNOSIS — R102 Pelvic and perineal pain: Secondary | ICD-10-CM | POA: Diagnosis present

## 2019-09-26 DIAGNOSIS — R109 Unspecified abdominal pain: Secondary | ICD-10-CM

## 2019-09-26 LAB — URINALYSIS, ROUTINE W REFLEX MICROSCOPIC
Bilirubin Urine: NEGATIVE
Glucose, UA: NEGATIVE mg/dL
Ketones, ur: NEGATIVE mg/dL
Leukocytes,Ua: NEGATIVE
Nitrite: NEGATIVE
Protein, ur: NEGATIVE mg/dL
Specific Gravity, Urine: 1.02 (ref 1.005–1.030)
pH: 7.5 (ref 5.0–8.0)

## 2019-09-26 LAB — COMPREHENSIVE METABOLIC PANEL
ALT: 30 U/L (ref 0–44)
AST: 24 U/L (ref 15–41)
Albumin: 4.3 g/dL (ref 3.5–5.0)
Alkaline Phosphatase: 83 U/L (ref 38–126)
Anion gap: 12 (ref 5–15)
BUN: 8 mg/dL (ref 6–20)
CO2: 21 mmol/L — ABNORMAL LOW (ref 22–32)
Calcium: 9.7 mg/dL (ref 8.9–10.3)
Chloride: 105 mmol/L (ref 98–111)
Creatinine, Ser: 0.54 mg/dL (ref 0.44–1.00)
GFR calc Af Amer: >60 mL/min (ref >60)
GFR calc non Af Amer: >60 mL/min (ref >60)
Glucose, Bld: 88 mg/dL (ref 70–99)
Potassium: 3.7 mmol/L (ref 3.5–5.1)
Sodium: 138 mmol/L (ref 135–145)
Total Bilirubin: 0.3 mg/dL (ref 0.3–1.2)
Total Protein: 8.1 g/dL (ref 6.5–8.1)

## 2019-09-26 LAB — CBC WITH DIFFERENTIAL/PLATELET
Abs Immature Granulocytes: 0.02 10*3/uL (ref 0.00–0.07)
Basophils Absolute: 0 10*3/uL (ref 0.0–0.1)
Basophils Relative: 0 %
Eosinophils Absolute: 0.2 10*3/uL (ref 0.0–0.5)
Eosinophils Relative: 2 %
HCT: 42.5 % (ref 36.0–46.0)
Hemoglobin: 13.7 g/dL (ref 12.0–15.0)
Immature Granulocytes: 0 %
Lymphocytes Relative: 27 %
Lymphs Abs: 2.4 10*3/uL (ref 0.7–4.0)
MCH: 27.7 pg (ref 26.0–34.0)
MCHC: 32.2 g/dL (ref 30.0–36.0)
MCV: 85.9 fL (ref 80.0–100.0)
Monocytes Absolute: 0.6 10*3/uL (ref 0.1–1.0)
Monocytes Relative: 7 %
Neutro Abs: 5.8 10*3/uL (ref 1.7–7.7)
Neutrophils Relative %: 64 %
Platelets: 272 10*3/uL (ref 150–400)
RBC: 4.95 MIL/uL (ref 3.87–5.11)
RDW: 13.7 % (ref 11.5–15.5)
WBC: 9 10*3/uL (ref 4.0–10.5)
nRBC: 0 % (ref 0.0–0.2)

## 2019-09-26 LAB — ABO/RH: ABO/RH(D): B POS

## 2019-09-26 LAB — HCG, QUANTITATIVE, PREGNANCY: hCG, Beta Chain, Quant, S: 418 m[IU]/mL — ABNORMAL HIGH (ref <5)

## 2019-09-26 LAB — URINALYSIS, MICROSCOPIC (REFLEX)

## 2019-09-26 LAB — PREGNANCY, URINE: Preg Test, Ur: POSITIVE — AB

## 2019-09-26 MED ORDER — FENTANYL CITRATE (PF) 100 MCG/2ML IJ SOLN: 50.0000 ug | Freq: Once | INTRAMUSCULAR | Status: DC

## 2019-09-26 MED ORDER — PRENATAL COMPLETE 14-0.4 MG PO TABS: 1.0000 | ORAL_TABLET | Freq: Every day | ORAL | 3 refills | Status: DC

## 2019-09-26 NOTE — ED Provider Notes (Signed)
Emergency Department Provider Note   I have reviewed the triage vital signs and the nursing notes.   HISTORY  Chief Complaint Abdominal Pain   HPI Jean Saunders is a 28 y.o. female who is here with left pelvic pain and small amount of intermittent vaginal bleeding.  Stopped her birth control a few weeks ago.  Symptoms started about 5 days ago but were intermittent but now the pain is more consistent.  Not severe.  No discharge.  No actual urinary symptoms.  No other associated symptoms.   No other associated or modifying symptoms.    Past Medical History:  Diagnosis Date  . Ectopic pregnancy     There are no active problems to display for this patient.   History reviewed. No pertinent surgical history.  Current Outpatient Rx  . Order #: [id number] Class: Print  . Order #: [id number] Class: Print  . Order #: [id number] Class: Print  . Order #: [id number] Class: Historical Med    Allergies Patient has no known allergies.  History reviewed. No pertinent family history.  Social History Social History   Tobacco Use  . Smoking status: Never Smoker  . Smokeless tobacco: Never Used  Substance Use Topics  . Alcohol use: No  . Drug use: Not on file    Review of Systems  All other systems negative except as documented in the HPI. All pertinent positives and negatives as reviewed in the HPI. ____________________________________________   PHYSICAL EXAM:  VITAL SIGNS: ED Triage Vitals  Enc Vitals Group     BP 09/26/19 0256 (!) 180/93     Pulse Rate 09/26/19 0256 90     Resp 09/26/19 0256 18     Temp 09/26/19 0256 98.4 F (36.9 C)     Temp Source 09/26/19 0256 Oral     SpO2 09/26/19 0256 100 %     Weight 09/26/19 0258 185 lb (83.9 kg)     Height 09/26/19 0258 5\' 2"  (1.575 m)    Constitutional: Alert and oriented. Well appearing and in no acute distress. Eyes: Conjunctivae are normal. PERRL. EOMI. Head: Atraumatic. Nose: No congestion/rhinnorhea.  Mouth/Throat: Mucous membranes are moist.  Oropharynx non-erythematous. Neck: No stridor.  No meningeal signs.   Cardiovascular: Normal rate, regular rhythm. Good peripheral circulation. Grossly normal heart sounds.   Respiratory: Normal respiratory effort.  No retractions. Lungs CTAB. Gastrointestinal: Soft and nontender. No distention.  Musculoskeletal: No lower extremity tenderness nor edema. No gross deformities of extremities. Neurologic:  Normal speech and language. No gross focal neurologic deficits are appreciated.  Skin:  Skin is warm, dry and intact. No rash noted.   ____________________________________________   LABS (all labs ordered are listed, but only abnormal results are displayed)  Labs Reviewed  PREGNANCY, URINE - Abnormal; Notable for the following components:      Result Value   Preg Test, Ur POSITIVE (*)    All other components within normal limits  URINALYSIS, ROUTINE W REFLEX MICROSCOPIC - Abnormal; Notable for the following components:   Hgb urine dipstick LARGE (*)    All other components within normal limits  URINALYSIS, MICROSCOPIC (REFLEX) - Abnormal; Notable for the following components:   Bacteria, UA FEW (*)    All other components within normal limits  HCG, QUANTITATIVE, PREGNANCY - Abnormal; Notable for the following components:   hCG, Beta Chain, Quant, S 418 (*)    All other components within normal limits  COMPREHENSIVE METABOLIC PANEL - Abnormal; Notable for the following components:   CO2 21 (*)  All other components within normal limits  CBC WITH DIFFERENTIAL/PLATELET  ABO/RH   ____________________________________________    INITIAL IMPRESSION / ASSESSMENT AND PLAN / ED COURSE  Pregnancy of unknown location with mild pain. hcg 418, will not be able to locate pregnancy with [place] at this time, will have her return for same and ob follow up to ensure no ectopic. Hb stable, bp's improved here and not hypotensive or tachycardic to suggest  rupture. Stable for dc at this time.      Pertinent labs & imaging results that were available during my care of the patient were reviewed by me and considered in my medical decision making (see chart for details).   A medical screening exam was performed and I feel the patient has had an appropriate workup for their chief complaint at this time and likelihood of emergent condition existing is low. They have been counseled on decision, discharge, follow up and which symptoms necessitate immediate return to the emergency department. They or their family verbally stated understanding and agreement with plan and discharged in stable condition.   ____________________________________________  FINAL CLINICAL IMPRESSION(S) / ED DIAGNOSES  Final diagnoses:  Abdominal pain, unspecified abdominal location  Less than [redacted] weeks gestation of pregnancy     MEDICATIONS GIVEN DURING THIS VISIT:  Medications  fentaNYL (SUBLIMAZE) injection 50 mcg (0 mcg Intravenous Hold 09/26/19 0404)     NEW OUTPATIENT MEDICATIONS STARTED DURING THIS VISIT:  New Prescriptions   PRENATAL VIT-FE FUMARATE-FA (PRENATAL COMPLETE) 14-0.4 MG TABS    Take 1 tablet by mouth daily.    Note:  This note was prepared with assistance of Dragon voice recognition software. Occasional wrong-word or sound-a-like substitutions may have occurred due to the inherent limitations of voice recognition software.   Jaz Laningham, Barbara Cower, MD 09/26/19 [phone]

## 2019-09-26 NOTE — ED Provider Notes (Signed)
Patient returned for ultrasound.  She had a positive pregnancy test with beta hCG of roughly 400.  Patient does report some left-sided cramping as well as mild spotting.  She is G5 P3-0-1-3 with history of ectopic.  Patient's ultrasound today shows no intrauterine pregnancy, but also no evidence for ectopic.  I feel as though patient will require time to determine whether this is a viable pregnancy or not.  Radiology is recommending a repeat ultrasound in the next 10 to 14 days.  Patient is to follow-up with her OB/GYN in the next few days for a repeat hCG and understands to return if she develops worsening pain or bleeding.   Veryl Speak, MD 09/26/19 1350

## 2019-09-26 NOTE — ED Triage Notes (Signed)
Presents with lower left sided abdominal pain/pelvic pain that began 5 days ago and started out as coming and going, tonight the pain got much worse and 2 hours ago the pain began to radiate down the left leg. She endorses vaginal bleeding that is light. She recently stopped her birth control.

## 2019-09-26 NOTE — Discharge Instructions (Signed)
Please return as scheduled for [place]. It likely won't sure the location of pregnancy and you will need to see your Ob/Gyn for further follow up care.

## 2020-06-29 IMAGING — US US OB COMP LESS 14 WK
1 series · 13 of 28 positions shown · non-contrast
Comparison: None.

CLINICAL DATA: C/o sporadic LLQ pain and mild vaginal spotting x 5
days. States she stopped taking BCP's in late [REDACTED] due to
break-through bleeding continually occurring. Hx prior ectopic
pregnancy in 5655 that required surgery

Status estimated gestational age by last menstrual period equals 2
weeks 5 days. Beta HCG equal 418
EXAM:
OBSTETRIC <14 WK US AND TRANSVAGINAL OB US
TECHNIQUE: Both transabdominal and transvaginal ultrasound examinations were
performed for complete evaluation of the gestation as well as the
maternal uterus, adnexal regions, and pelvic cul-de-sac.
Transvaginal technique was performed to assess early pregnancy.

[Series 1: us ob comp less 14 wk · 13 of 62 slices shown]
[im 3/62]
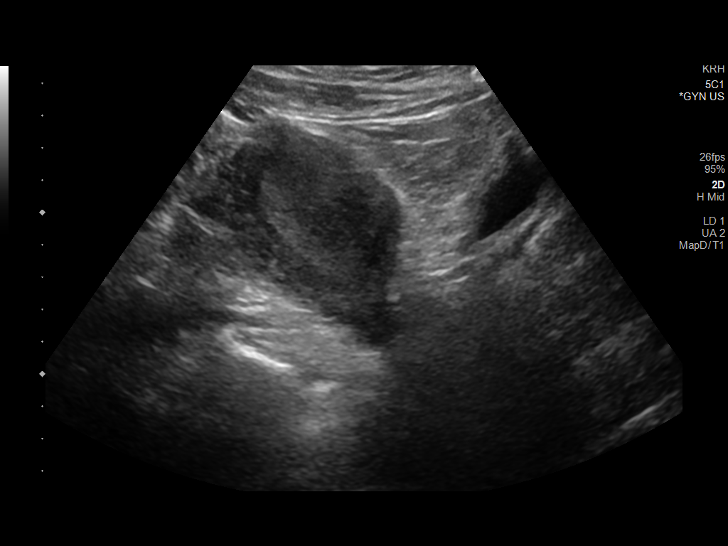
[im 7/62]
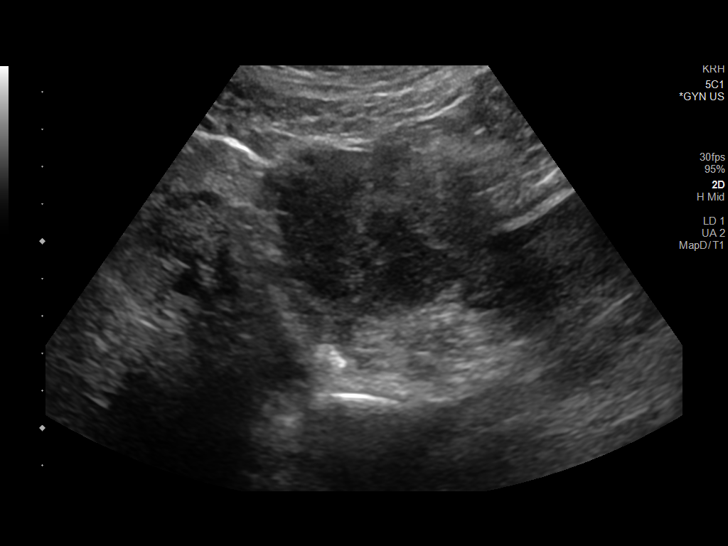
[im 12/62]
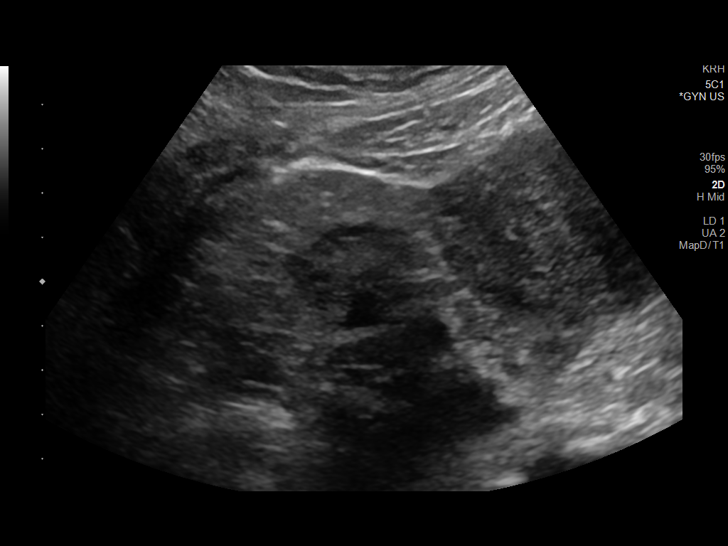
[im 16/62]
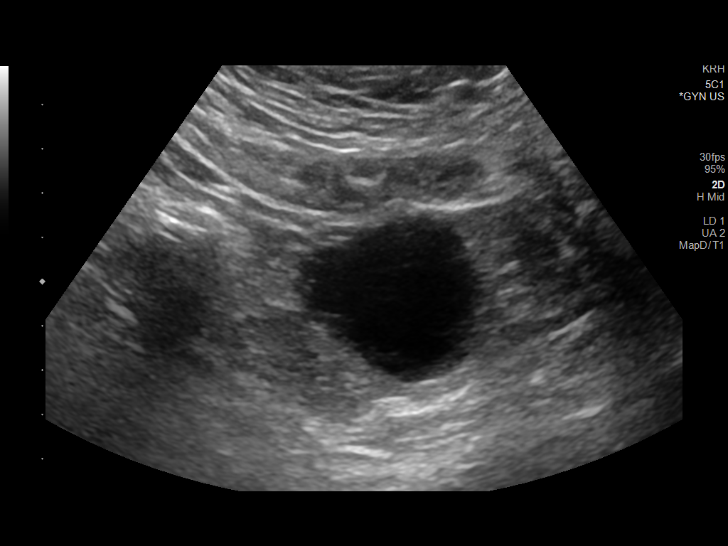
[im 21/62]
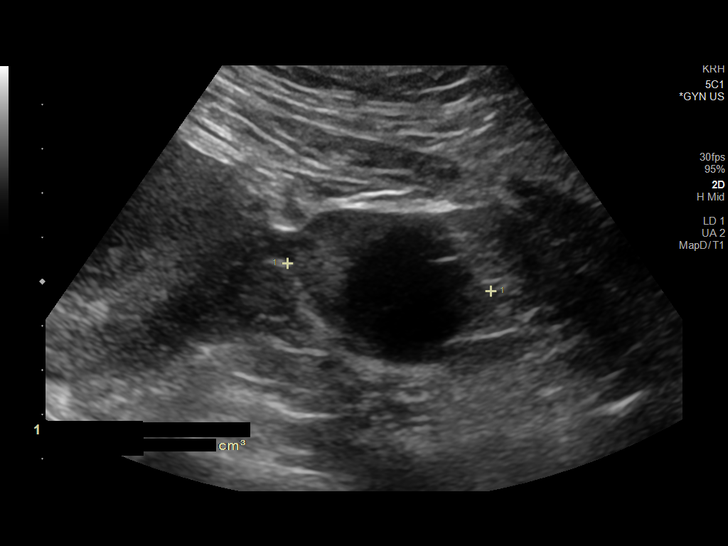
[im 25/62]
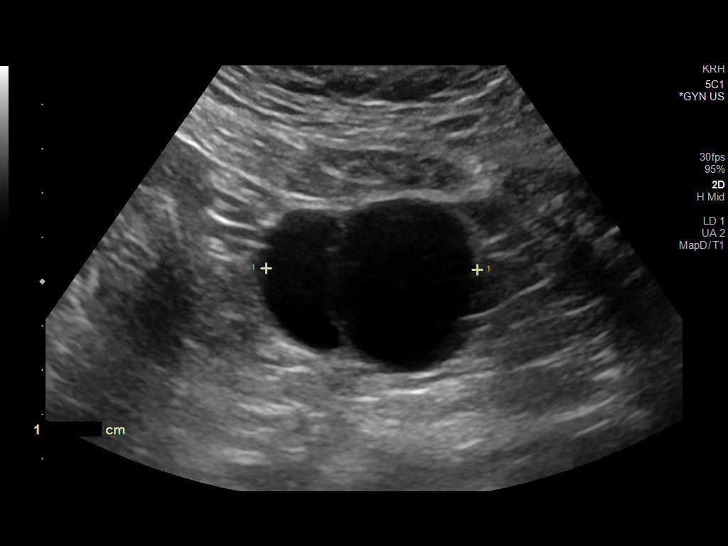
[im 32/62]
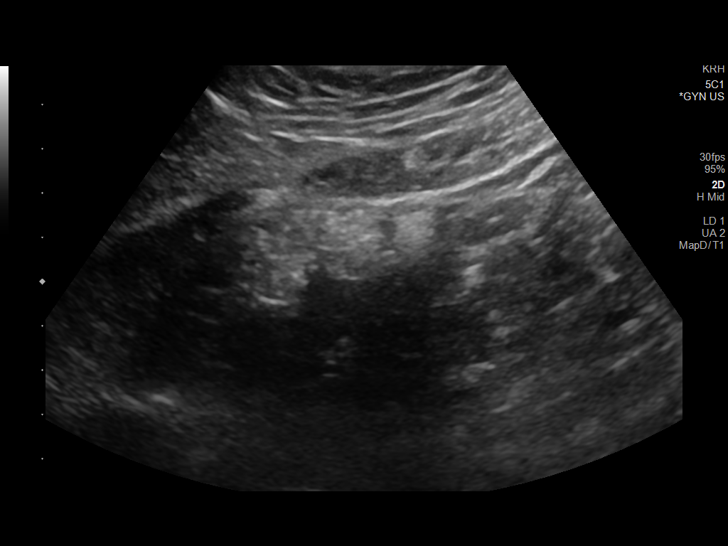
[im 37/62]
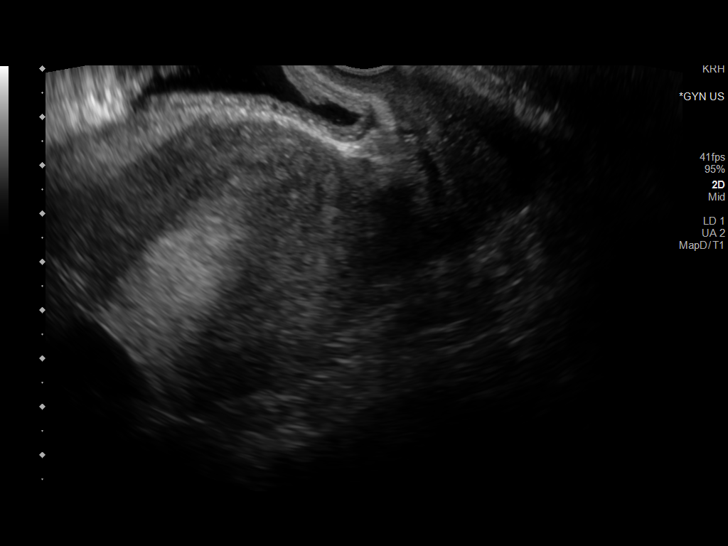
[im 41/62]
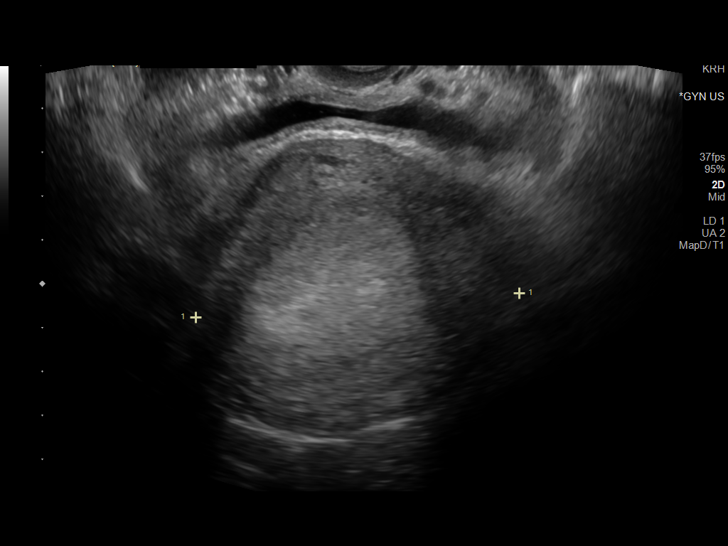
[im 46/62]
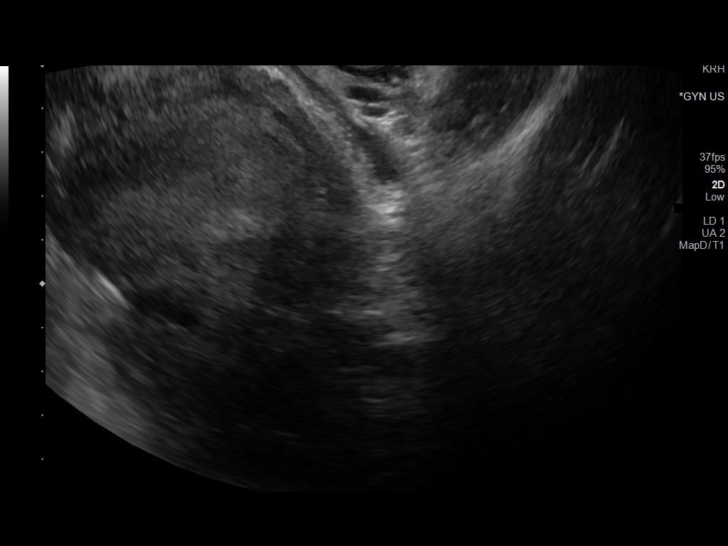
[im 50/62]
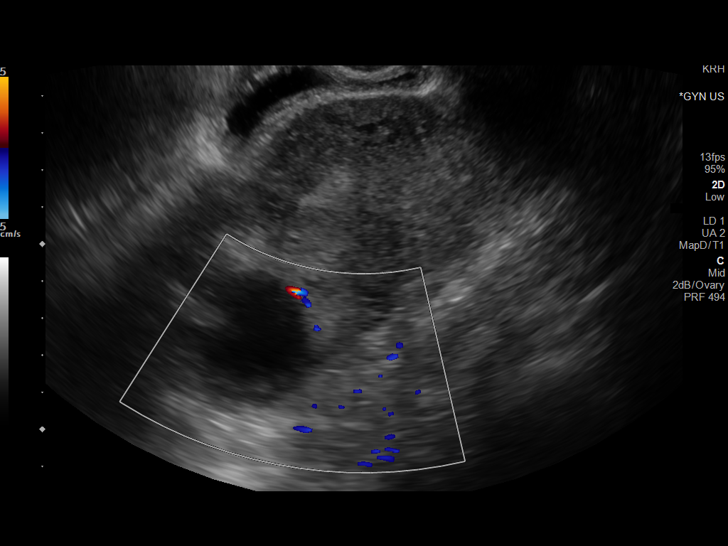
[im 55/62]
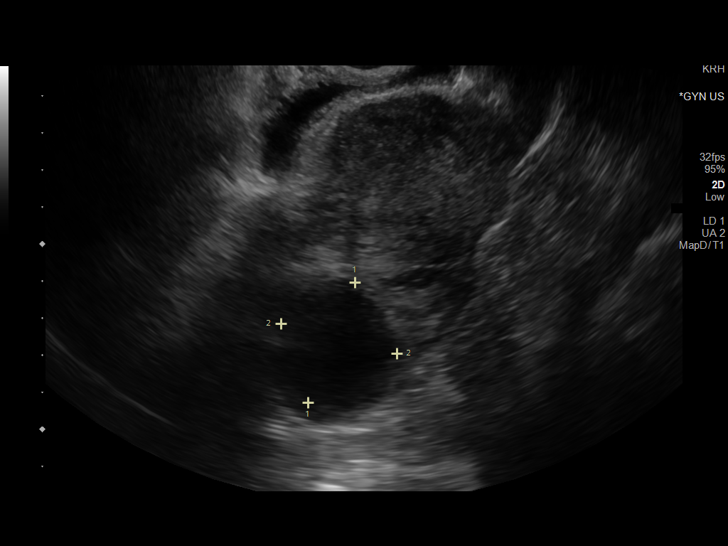
[im 59/62]
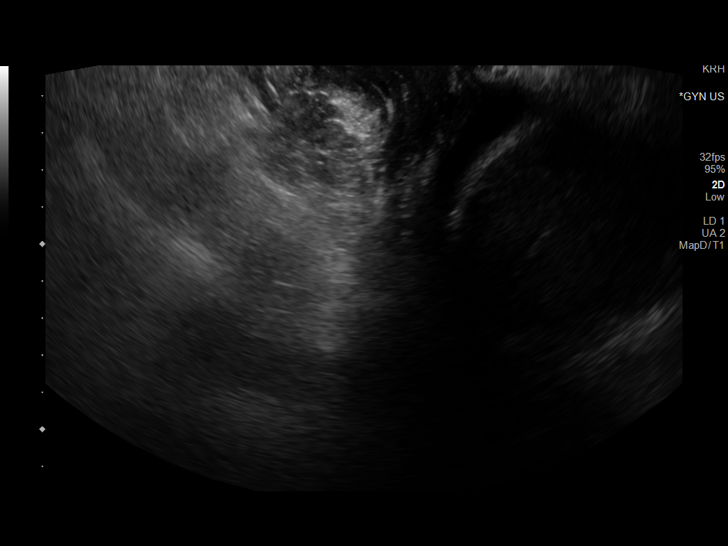

[13 of 28 positions shown; findings below may reference images not displayed]

FINDINGS: Intrauterine gestational sac: Not identified

Yolk sac:  Not identified

Embryo:  Not identified

Subchorionic hemorrhage:  None visualized.

Maternal uterus/adnexae: RIGHT ovary normal on abdominal exam
measuring 4 0.0 x 1.4 cm.

LEFT ovary measures 5.6 x 3.2 cm and contains a 4 cm cyst structure
with potential nonvascular septation.

No free fluid
IMPRESSION: No intrauterine gestational sac, yolk sac, or fetal pole identified.
Differential considerations include intrauterine pregnancy too early
to be sonographically visualized, missed abortion, or ectopic
pregnancy. Followup ultrasound is recommended in 10-14 days for
further evaluation.

Probable functional ovarian cyst of the LEFT ovary.

## 2020-09-15 ENCOUNTER — Emergency Department (HOSPITAL_BASED_OUTPATIENT_CLINIC_OR_DEPARTMENT_OTHER): Payer: 59

## 2020-09-15 ENCOUNTER — Encounter (HOSPITAL_BASED_OUTPATIENT_CLINIC_OR_DEPARTMENT_OTHER): Payer: Self-pay | Admitting: *Deleted

## 2020-09-15 ENCOUNTER — Other Ambulatory Visit: Payer: Self-pay

## 2020-09-15 ENCOUNTER — Emergency Department (HOSPITAL_BASED_OUTPATIENT_CLINIC_OR_DEPARTMENT_OTHER)
Admission: EM | Admit: 2020-09-15 | Discharge: 2020-09-15 | Disposition: A | Discharge status: Home / Self Care | Payer: 59 | Attending: Emergency Medicine | Admitting: Emergency Medicine

## 2020-09-15 DIAGNOSIS — Z20822 Contact with and (suspected) exposure to covid-19: Secondary | ICD-10-CM | POA: Diagnosis not present

## 2020-09-15 DIAGNOSIS — J069 Acute upper respiratory infection, unspecified: Secondary | ICD-10-CM | POA: Diagnosis not present

## 2020-09-15 DIAGNOSIS — M791 Myalgia, unspecified site: Secondary | ICD-10-CM | POA: Insufficient documentation

## 2020-09-15 DIAGNOSIS — Z79899 Other long term (current) drug therapy: Secondary | ICD-10-CM | POA: Diagnosis not present

## 2020-09-15 DIAGNOSIS — R5383 Other fatigue: Secondary | ICD-10-CM | POA: Insufficient documentation

## 2020-09-15 DIAGNOSIS — R05 Cough: Secondary | ICD-10-CM | POA: Diagnosis present

## 2020-09-15 LAB — PREGNANCY, URINE: Preg Test, Ur: NEGATIVE

## 2020-09-15 LAB — SARS CORONAVIRUS 2 BY RT PCR (HOSPITAL ORDER, PERFORMED IN ~~LOC~~ HOSPITAL LAB): SARS Coronavirus 2: NEGATIVE

## 2020-09-15 MED ORDER — ALBUTEROL SULFATE HFA 108 (90 BASE) MCG/ACT IN AERS: 1.0000 | INHALATION_SPRAY | Freq: Four times a day (QID) | RESPIRATORY_TRACT | 0 refills | Status: AC | PRN

## 2020-09-15 MED ORDER — PREDNISONE 20 MG PO TABS: 40.0000 mg | ORAL_TABLET | Freq: Every day | ORAL | 0 refills | Status: AC

## 2020-09-15 MED FILL — ALBUTEROL SULFATE HFA 108 (: 108 (90 BAS | 25 days supply | Qty: 9 | Fill #0

## 2020-09-15 MED FILL — predniSONE 20 MG TABS: 20 | 4 days supply | Qty: 8 | Fill #0

## 2020-09-15 NOTE — ED Triage Notes (Signed)
Covid symptoms

## 2020-09-15 NOTE — ED Provider Notes (Signed)
[facility] EMERGENCY DEPARTMENT Provider Note   CSN: [id number] Arrival date & time: 09/15/20  1320     History No chief complaint on file.   Jean Saunders is a 29 y.o. female who presents for evaluation of 3 days of body aches, cough, fatigue, headache, wheezing.  She states that she has not noted any fever.  She states that she has felt rundown and tired.  Cough is productive of yellow-green phlegm.  She reports that when she coughs, she feels a soreness in her midsternal chest area.  She states she has been using an albuterol inhaler since yesterday with minimal improvement.  She states that she does have a history of smoking.  She smokes about 3 [place] today.  She did get Covid vaccinated.  No known Covid exposure that she knows of.  She denies any vomiting, abdominal pain.  The history is provided by the patient.       Past Medical History:  Diagnosis Date  . Ectopic pregnancy     There are no problems to display for this patient.   History reviewed. No pertinent surgical history.   OB History   No obstetric history on file.     No family history on file.  Social History   Tobacco Use  . Smoking status: Never Smoker  . Smokeless tobacco: Never Used  Substance Use Topics  . Alcohol use: No  . Drug use: Not on file    Home Medications Prior to Admission medications   Medication Sig Start Date End Date Taking? Authorizing Provider  norgestimate-ethinyl estradiol (ESTARYLLA) 0.25-35 MG-MCG tablet Take 1 tablet by mouth daily.   Yes [provider]  sertraline (ZOLOFT) 50 MG tablet Take 50 mg by mouth daily. 06/30/20  Yes [provider]  valACYclovir (VALTREX) 500 MG tablet Take 500 mg by mouth 2 (two) times daily as needed (flare ups).    Yes [provider]  albuterol (VENTOLIN HFA) 108 (90 Base) MCG/ACT inhaler Inhale 1-2 puffs into the lungs every 6 (six) hours as needed for wheezing or shortness of breath. 09/15/20    Maxwell Caul, PA-C  predniSONE (DELTASONE) 20 MG tablet Take 2 tablets (40 mg total) by mouth daily for 4 days. 09/15/20 09/19/20  Maxwell Caul, PA-C    Allergies    Patient has no known allergies.  Review of Systems   Review of Systems  Constitutional: Positive for fatigue. Negative for fever.  Respiratory: Positive for cough and wheezing. Negative for shortness of breath.   Cardiovascular: Negative for chest pain.  Gastrointestinal: Negative for abdominal pain, nausea and vomiting.  Genitourinary: Negative for dysuria and hematuria.  Musculoskeletal: Positive for myalgias.  Neurological: Negative for headaches.  All other systems reviewed and are negative.   Physical Exam Updated Vital Signs BP (!) 147/104 (BP Location: Left Arm)   Pulse (!) 101   Temp 98.9 F (37.2 C) (Oral)   Resp 18   Ht 5\' 2"  (1.575 m)   Wt 83.9 kg   LMP 05/04/2020 (Within Weeks)   SpO2 100%   BMI 33.84 kg/m   Physical Exam Vitals and nursing note reviewed.  Constitutional:      Appearance: She is well-developed.  HENT:     Head: Normocephalic and atraumatic.  Eyes:     General: No scleral icterus.       Right eye: No discharge.        Left eye: No discharge.     Conjunctiva/sclera: Conjunctivae normal.  Pulmonary:     Effort: Pulmonary effort is normal.     Comments: Intermittently coughing.  No evidence of respiratory stress.  Very mild wheezing noted.  No rhonchi or rales. Skin:    General: Skin is warm and dry.  Neurological:     Mental Status: She is alert.  Psychiatric:        Speech: Speech normal.        Behavior: Behavior normal.     ED Results / Procedures / Treatments   Labs (all labs ordered are listed, but only abnormal results are displayed) Labs Reviewed  SARS CORONAVIRUS 2 BY RT PCR (HOSPITAL ORDER, PERFORMED IN [facility])  PREGNANCY, URINE    EKG None  Radiology DG Chest Portable 1 View  Result Date: 09/15/2020 CLINICAL DATA:  Cough  and congestion for 2-3 days, no fever, loss of taste and smell EXAM: PORTABLE CHEST 1 VIEW COMPARISON:  None. FINDINGS: Mild airways thickening. No consolidation, features of edema, pneumothorax, or effusion. Pulmonary vascularity is normally distributed. The cardiomediastinal contours are unremarkable. No acute osseous or soft tissue abnormality. IMPRESSION: Mild airways thickening without focal consolidation. Could reflect an early bronchitis or reactive airways disease. Electronically Signed   By: Kreg Shropshire M.D.   On: 09/15/2020 16:32    Procedures Procedures (including critical care time)  Medications Ordered in ED Medications - No data to display  ED Course  I have reviewed the triage vital signs and the nursing notes.  Pertinent labs & imaging results that were available during my care of the patient were reviewed by me and considered in my medical decision making (see chart for details).    MDM Rules/Calculators/A&P                          29 year old female who presents for evaluation of 3 days of fatigue, myalgias, cough, wheezing.  Used an albuterol inhaler minimal relief.  History of smoking.  She has gotten Covid vaccinated.  On initial ED arrival, she is afebrile, nontoxic-appearing.  She is slightly tachycardic but vitals otherwise reassuring.  On exam, she has intermittent coughing but no evidence of respiratory distress.  Consider viral URI with cough versus bronchitis versus COVID-19.  COVID-19 is negative.  Chest x-ray shows no signs of pneumonia.  Question of there is early bronchitis/reactive airway disease.  Discussed results with patient.  Patient's urine pregnancy is negative.  We will plan to treat with albuterol inhaler, short course of prednisone for supportive care and relief.  Encouraged at home supportive care measures. At this time, patient exhibits no emergent life-threatening condition that require further evaluation in ED. Patient had ample opportunity for  questions and discussion. All patient's questions were answered with full understanding. Strict return precautions discussed. Patient expresses understanding and agreement to plan.   Jean Saunders was evaluated in Emergency Department on 09/15/2020 for the symptoms described in the history of present illness. She was evaluated in the context of the global COVID-19 pandemic, which necessitated consideration that the patient might be at risk for infection with the SARS-CoV-2 virus that causes COVID-19. Institutional protocols and algorithms that pertain to the evaluation of patients at risk for COVID-19 are in a state of rapid change based on information released by regulatory bodies including the CDC and federal and state organizations. These policies and algorithms were followed during the patient's care in the ED.  Portions of this note were generated with Scientist, clinical (histocompatibility and immunogenetics). Dictation  errors may occur despite best attempts at proofreading.  Final Clinical Impression(s) / ED Diagnoses Final diagnoses:  Viral URI with cough    Rx / DC Orders ED Discharge Orders         Ordered    predniSONE (DELTASONE) 20 MG tablet  Daily        09/15/20 1650    albuterol (VENTOLIN HFA) 108 (90 Base) MCG/ACT inhaler  Every 6 hours PRN        09/15/20 1650           Rosana Hoes 09/15/20 1700    Pollyann Savoy, MD 09/15/20 1754

## 2020-09-15 NOTE — Discharge Instructions (Signed)
Take prednisone as directed.  Use albuterol inhaler as directed.  Return emergency department for any worsening difficulty breathing, fevers, persistent vomiting.  As we discussed, for an several days, you are still have symptoms, you should consider getting retested for Covid as sometimes you can get false negative.

## 2021-06-19 IMAGING — DX DG CHEST 1V PORT
1 series · 1 of 1 positions shown · non-contrast
Comparison: None.

CLINICAL DATA: Cough and congestion for 2-3 days, no fever, loss of
taste and smell

EXAM:
PORTABLE CHEST 1 VIEW

[chest ap]
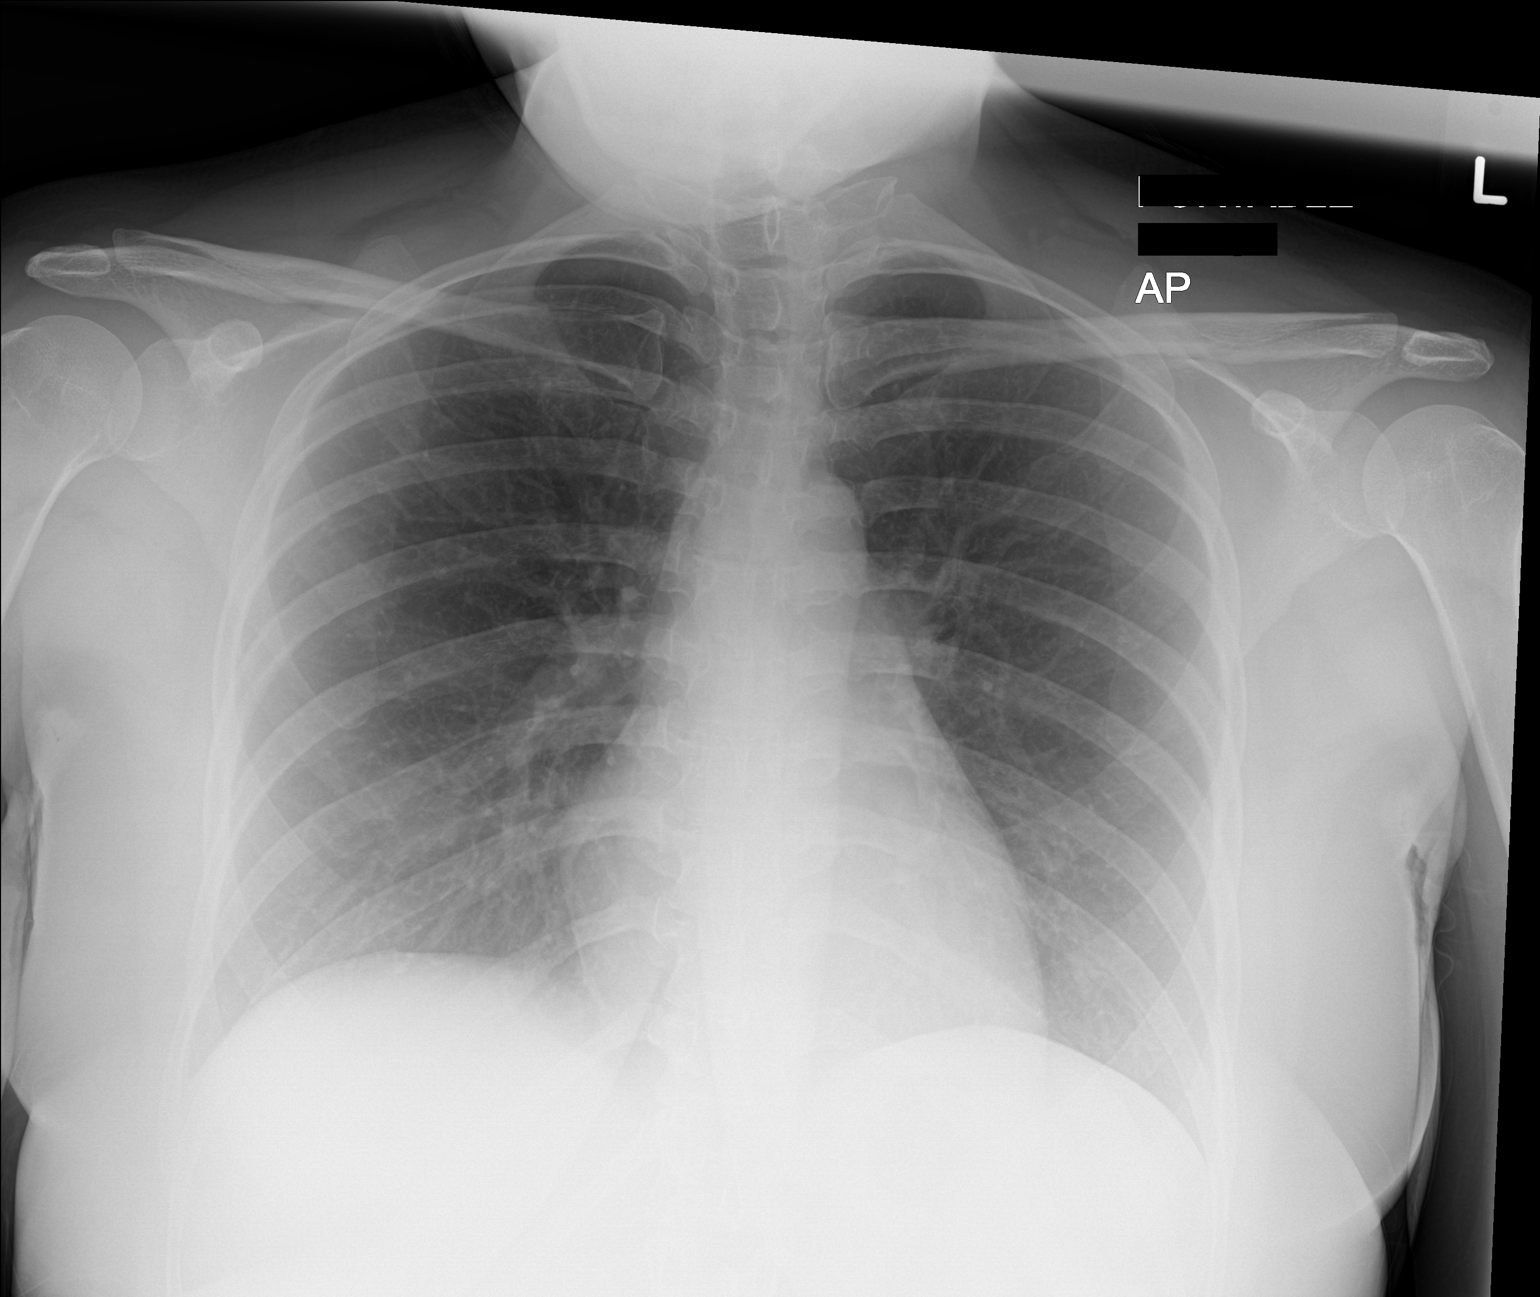

[1 of 1 positions shown; findings below may reference images not displayed]

FINDINGS: Mild airways thickening. No consolidation, features of edema,
pneumothorax, or effusion. Pulmonary vascularity is normally
distributed. The cardiomediastinal contours are unremarkable. No
acute osseous or soft tissue abnormality.
IMPRESSION: Mild airways thickening without focal consolidation. Could reflect
an early bronchitis or reactive airways disease.
